# Patient Record
Sex: Male | Born: 1980 | Race: White | Hispanic: No | Marital: Married | Smoking: Current every day smoker
Health system: Southern US, Community
[De-identification: ages and names within clinical notes are randomized; demographics above are authoritative.]

## PROBLEM LIST (undated history)

## (undated) DIAGNOSIS — G039 Meningitis, unspecified: Secondary | ICD-10-CM

---

## 2014-10-22 ENCOUNTER — Encounter (HOSPITAL_BASED_OUTPATIENT_CLINIC_OR_DEPARTMENT_OTHER): Payer: Self-pay | Admitting: *Deleted

## 2014-10-22 ENCOUNTER — Emergency Department (HOSPITAL_BASED_OUTPATIENT_CLINIC_OR_DEPARTMENT_OTHER): Payer: 59

## 2014-10-22 ENCOUNTER — Emergency Department (HOSPITAL_BASED_OUTPATIENT_CLINIC_OR_DEPARTMENT_OTHER)
Admission: EM | Admit: 2014-10-22 | Discharge: 2014-10-22 | Disposition: A | Discharge status: Home / Self Care | Payer: 59 | Attending: Emergency Medicine | Admitting: Emergency Medicine

## 2014-10-22 DIAGNOSIS — M791 Myalgia, unspecified site: Secondary | ICD-10-CM

## 2014-10-22 DIAGNOSIS — Z8669 Personal history of other diseases of the nervous system and sense organs: Secondary | ICD-10-CM | POA: Diagnosis not present

## 2014-10-22 DIAGNOSIS — R519 Headache, unspecified: Secondary | ICD-10-CM

## 2014-10-22 DIAGNOSIS — Z720 Tobacco use: Secondary | ICD-10-CM | POA: Insufficient documentation

## 2014-10-22 DIAGNOSIS — R51 Headache: Secondary | ICD-10-CM | POA: Diagnosis not present

## 2014-10-22 DIAGNOSIS — R05 Cough: Secondary | ICD-10-CM | POA: Insufficient documentation

## 2014-10-22 DIAGNOSIS — R21 Rash and other nonspecific skin eruption: Secondary | ICD-10-CM | POA: Insufficient documentation

## 2014-10-22 DIAGNOSIS — R509 Fever, unspecified: Secondary | ICD-10-CM | POA: Diagnosis not present

## 2014-10-22 HISTORY — DX: Meningitis, unspecified: G03.9

## 2014-10-22 LAB — CK: Total CK: 136 U/L (ref 7–232)

## 2014-10-22 LAB — CBC WITH DIFFERENTIAL/PLATELET
BASOS ABS: 0 10*3/uL (ref 0.0–0.1)
Basophils Relative: 0 % (ref 0–1)
EOS ABS: 0 10*3/uL (ref 0.0–0.7)
Eosinophils Relative: 1 % (ref 0–5)
HEMATOCRIT: 43.3 % (ref 39.0–52.0)
HEMOGLOBIN: 14.8 g/dL (ref 13.0–17.0)
Lymphocytes Relative: 25 % (ref 12–46)
Lymphs Abs: 0.6 10*3/uL — ABNORMAL LOW (ref 0.7–4.0)
MCH: 31.4 pg (ref 26.0–34.0)
MCHC: 34.2 g/dL (ref 30.0–36.0)
MCV: 91.7 fL (ref 78.0–100.0)
MONOS PCT: 15 % — AB (ref 3–12)
Monocytes Absolute: 0.4 10*3/uL (ref 0.1–1.0)
NEUTROS ABS: 1.5 10*3/uL — AB (ref 1.7–7.7)
Neutrophils Relative %: 59 % (ref 43–77)
Platelets: 119 10*3/uL — ABNORMAL LOW (ref 150–400)
RBC: 4.72 MIL/uL (ref 4.22–5.81)
RDW: 12 % (ref 11.5–15.5)
WBC: 2.6 10*3/uL — AB (ref 4.0–10.5)

## 2014-10-22 LAB — URINALYSIS, ROUTINE W REFLEX MICROSCOPIC
BILIRUBIN URINE: NEGATIVE
GLUCOSE, UA: NEGATIVE mg/dL
KETONES UR: 15 mg/dL — AB
Leukocytes, UA: NEGATIVE
Nitrite: NEGATIVE
PH: 6.5 (ref 5.0–8.0)
Protein, ur: NEGATIVE mg/dL
SPECIFIC GRAVITY, URINE: 1.017 (ref 1.005–1.030)
Urobilinogen, UA: 1 mg/dL (ref 0.0–1.0)

## 2014-10-22 LAB — BASIC METABOLIC PANEL
Anion gap: 3 — ABNORMAL LOW (ref 5–15)
BUN: 15 mg/dL (ref 6–23)
CHLORIDE: 103 mmol/L (ref 96–112)
CO2: 26 mmol/L (ref 19–32)
CREATININE: 0.99 mg/dL (ref 0.50–1.35)
Calcium: 8.1 mg/dL — ABNORMAL LOW (ref 8.4–10.5)
GFR calc Af Amer: >90 mL/min (ref >90)
Glucose, Bld: 101 mg/dL — ABNORMAL HIGH (ref 70–99)
Potassium: 3.5 mmol/L (ref 3.5–5.1)
Sodium: 132 mmol/L — ABNORMAL LOW (ref 135–145)

## 2014-10-22 LAB — URINE MICROSCOPIC-ADD ON

## 2014-10-22 LAB — RAPID STREP SCREEN (MED CTR MEBANE ONLY): STREPTOCOCCUS, GROUP A SCREEN (DIRECT): NEGATIVE

## 2014-10-22 MED ORDER — SODIUM CHLORIDE 0.9 % IV BOLUS (SEPSIS)
1000.0000 mL | Freq: Once | INTRAVENOUS | Status: AC
  Administered 2014-10-22: 1000 mL via INTRAVENOUS

## 2014-10-22 MED ORDER — ONDANSETRON HCL 4 MG/2ML IJ SOLN
4.0000 mg | Freq: Once | INTRAMUSCULAR | Status: AC
  Administered 2014-10-22: 4 mg via INTRAVENOUS
  Filled 2014-10-22: qty 2

## 2014-10-22 MED ORDER — ONDANSETRON 4 MG PO TBDP: ORAL_TABLET | ORAL | Status: AC

## 2014-10-22 NOTE — ED Provider Notes (Signed)
CSN: [id number]     Arrival date & time 10/22/14  0913 History   First MD Initiated Contact with Patient 10/22/14 [phone]     Chief Complaint  Patient presents with  . Fever     (Consider location/radiation/quality/duration/timing/severity/associated sxs/prior Treatment) Patient is a 34 y.o. male presenting with cough.  Cough Cough characteristics:  Non-productive Severity:  Moderate Onset quality:  Gradual Duration:  4 days Timing:  Constant Progression:  Worsening Chronicity:  New Relieved by:  Nothing Worsened by:  Nothing tried Associated symptoms: chills, fever (tmax 102), myalgias, rash (R arm) and sinus congestion   Associated symptoms: no chest pain and no shortness of breath     Past Medical History  Diagnosis Date  . Meningitis    History reviewed. No pertinent past surgical history. No family history on file. History  Substance Use Topics  . Smoking status: Current Every Day Smoker -- 1.00 packs/day    Types: Cigarettes  . Smokeless tobacco: Not on file  . Alcohol Use: Not on file    Review of Systems  Constitutional: Positive for fever (tmax 102) and chills.  Respiratory: Positive for cough. Negative for shortness of breath.   Cardiovascular: Negative for chest pain.  Musculoskeletal: Positive for myalgias.  Skin: Positive for rash (R arm).  All other systems reviewed and are negative.     Allergies  Doxycycline  Home Medications   Prior to Admission medications   Medication Sig Start Date End Date Taking? Authorizing Provider  ondansetron (ZOFRAN ODT) 4 MG disintegrating tablet  ODT q4 hours prn nausea/vomit 10/22/14   Mirian Mo, MD   BP 108/65 mmHg  Pulse 68  Temp(Src) 99.8 F (37.7 C) (Oral)  Resp 18  Ht 6' (1.829 m)  Wt 143 lb (64.864 kg)  BMI 19.39 kg/m2  SpO2 98% Physical Exam  Constitutional: He is oriented to person, place, and time. He appears well-developed and well-nourished.  HENT:  Head: Normocephalic and atraumatic.   Eyes: Conjunctivae and EOM are normal.  Neck: Normal range of motion. Neck supple. No Brudzinski's sign and no Kernig's sign noted.  Cardiovascular: Normal rate, regular rhythm and normal heart sounds.   Pulmonary/Chest: Effort normal and breath sounds normal. No respiratory distress.  Abdominal: He exhibits no distension. There is no tenderness. There is no rebound and no guarding.  Musculoskeletal: Normal range of motion.  Neurological: He is alert and oriented to person, place, and time.  Skin: Skin is warm and dry.  Vitals reviewed.   ED Course  Procedures (including critical care time) Labs Review Labs Reviewed  CBC WITH DIFFERENTIAL/PLATELET - Abnormal; Notable for the following:    WBC 2.6 (*)    Platelets 119 (*)    Neutro Abs 1.5 (*)    Lymphs Abs 0.6 (*)    Monocytes Relative 15 (*)    All other components within normal limits  BASIC METABOLIC PANEL - Abnormal; Notable for the following:    Sodium 132 (*)    Glucose, Bld 101 (*)    Calcium 8.1 (*)    Anion gap 3 (*)    All other components within normal limits  URINALYSIS, ROUTINE W REFLEX MICROSCOPIC - Abnormal; Notable for the following:    Hgb urine dipstick TRACE (*)    Ketones, ur 15 (*)    All other components within normal limits  RAPID STREP SCREEN  CULTURE, GROUP A STREP  CK  URINE MICROSCOPIC-ADD ON    Imaging Review Dg Chest 2 View  10/22/2014  CLINICAL DATA:  Cough, fever and sinus congestion.  Smoker.  EXAM: CHEST - 2 VIEW  COMPARISON:  [facility]  FINDINGS: The heart size and mediastinal contours are within normal limits. There is no evidence of pulmonary edema, consolidation, pneumothorax, nodule or pleural fluid. The visualized skeletal structures are unremarkable.  IMPRESSION: No active disease.   Electronically Signed   By: Irish LackGlenn  Yamagata M.D.   On: 10/22/2014 11:33     EKG Interpretation None      MDM   Final diagnoses:  Fever, unspecified fever  cause  Acute nonintractable headache, unspecified headache type  Myalgia    34 y.o. male with pertinent PMH of meningitis at age 406 mo and 34 yo presents with congestion, cough, myalgias, fever x 4 days.  Pt was seen by PCP, prescribed azithromycin.  Presents today due to continued symptoms. He states he had a ha, however this is improved.  Physical exam and vitals today as above.  No meningitic signs.    Wu unremarkable.  Symptoms improved with fluids.  I had a detailed discussion with pt re: likely viral etiology and possibility of viral meningitis, but doubt meningitis given appearance of pt with FROM, overall appearance.  Dorna BloomWu remarkable for neutropenia, pt also neutropenic 2 days ago, unchanged WBC per family today.  ANC 1.5, do not feel pt immunosuppressed at this time.  Discussed and offered admission at this time, and with shared decision making agreed to dc home with close PCP fu and return if necessary.   I have reviewed all laboratory and imaging studies if ordered as above  1. Fever, unspecified fever cause   2. Acute nonintractable headache, unspecified headache type   3. Myalgia          Mirian MoMatthew Aryon Nham, MD 10/23/14 [phone]

## 2014-10-22 NOTE — ED Notes (Signed)
C/o fever since Wednesday with h/a and neck pain. PT states when he looks up his h/a is worse. C/o sinus congestion and left ear pain.Has had cough with clear to brown sputum. Nauseated but no d/v. States hx of meningitis at age 696 months and at age 34.

## 2014-10-22 NOTE — Discharge Instructions (Signed)
Neutropenia (your absolute neutrophil count is 1500) Neutropenia is a condition that occurs when the level of a certain type of white blood cell (neutrophil) in your body becomes lower than normal. Neutrophils are made in the bone marrow and fight infections. These cells protect against bacteria and viruses. The fewer neutrophils you have, and the longer your body remains without them, the greater your risk of getting a severe infection becomes. CAUSES  The cause of neutropenia may be hard to determine. However, it is usually due to 3 main problems:   Decreased production of neutrophils. This may be due to:  Certain medicines such as chemotherapy.  Genetic problems.  Cancer.  Radiation treatments.  Vitamin deficiency.  Some pesticides.  Increased destruction of neutrophils. This may be due to:  Overwhelming infections.  Hemolytic anemia. This is when the body destroys its own blood cells.  Chemotherapy.  Neutrophils moving to areas of the body where they cannot fight infections. This may be due to:  Dialysis procedures.  Conditions where the spleen becomes enlarged. Neutrophils are held in the spleen and are not available to the rest of the body.  Overwhelming infections. The neutrophils are held in the area of the infection and are not available to the rest of the body. SYMPTOMS  There are no specific symptoms of neutropenia. The lack of neutrophils can result in an infection, and an infection can cause various problems. DIAGNOSIS  Diagnosis is made by a blood test. A complete blood count is performed. The normal level of neutrophils in human blood differs with age and race. Infants have lower counts than older children and adults. African Americans have lower counts than Caucasians or Asians. The average adult level is 1500 cells/mm3 of blood. Neutrophil counts are interpreted as follows:  Greater than 1000 cells/mm3 gives normal protection against infection.  500 to 1000  cells/mm3 gives an increased risk for infection.  200 to 500 cells/mm3 is a greater risk for severe infection.  Lower than 200 cells/mm3 is a marked risk of infection. This may require hospitalization and treatment with antibiotic medicines. TREATMENT  Treatment depends on the underlying cause, severity, and presence of infections or symptoms. It also depends on your health. Your caregiver will discuss the treatment plan with you. Mild cases are often easily treated and have a good outcome. Preventative measures may also be started to limit your risk of infections. Treatment can include:  Taking antibiotics.  Stopping medicines that are known to cause neutropenia.  Correcting nutritional deficiencies by eating green vegetables to supply folic acid and taking vitamin B supplements.  Stopping exposure to pesticides if your neutropenia is related to pesticide exposure.  Taking a blood growth factor called sargramostim, pegfilgrastim, or filgrastim if you are undergoing chemotherapy for cancer. This stimulates white blood cell production.  Removal of the spleen if you have Felty's syndrome and have repeated infections. HOME CARE INSTRUCTIONS   Follow your caregiver's instructions about when you need to have blood work done.  Wash your hands often. Make sure others who come in contact with you also wash their hands.  Wash raw fruits and vegetables before eating them. They can carry bacteria and fungi.  Avoid people with colds or spreadable (contagious) diseases (chickenpox, herpes zoster, influenza).  Avoid large crowds.  Avoid construction areas. The dust can release fungus into the air.  Be cautious around children in daycare or school environments.  Take care of your respiratory system by coughing and deep breathing.  Bathe daily.  Protect your skin from cuts and burns.  Do not work in the garden or with flowers and plants.  Care for the mouth before and after meals by  brushing with a soft toothbrush. If you have mucositis, do not use mouthwash. Mouthwash contains alcohol and can dry out the mouth even more.  Clean the area between the genitals and the anus (perineal area) after urination and bowel movements. Women need to wipe from front to back.  Use a water soluble lubricant during sexual intercourse and practice good hygiene after. Do not have intercourse if you are severely neutropenic. Check with your caregiver for guidelines.  Exercise daily as tolerated.  Avoid people who were vaccinated with a live vaccine in the past 30 days. You should not receive live vaccines (polio, typhoid).  Do not provide direct care for pets. Avoid animal droppings. Do not clean litter boxes and bird cages.  Do not share food utensils.  Do not use tampons, enemas, or rectal suppositories unless directed by your caregiver.  Use an electric razor to remove hair.  Wash your hands after handling magazines, letters, and newspapers. SEEK IMMEDIATE MEDICAL CARE IF:   You have a fever.  You have chills or start to shake.  You feel nauseous or vomit.  You develop mouth sores.  You develop aches and pains.  You have redness and swelling around open wounds.  Your skin is warm to the touch.  You have pus coming from your wounds.  You develop swollen lymph nodes.  You feel weak or fatigued.  You develop red streaks on the skin. MAKE SURE YOU:  Understand these instructions.  Will watch your condition.  Will get help right away if you are not doing well or get worse. Document Released: 01/25/2002 Document Revised: 10/28/2011 Document Reviewed: 02/22/2011 [facility] Patient Information 2015 [place], [place]. This information is not intended to replace advice given to you by your health care provider. Make sure you discuss any questions you have with your health care provider.  Sinus Headache A sinus headache is when your sinuses become clogged or swollen. Sinus  headaches can range from mild to severe.  CAUSES A sinus headache can have different causes, such as:  Colds.  Sinus infections.  Allergies. SYMPTOMS  Symptoms of a sinus headache may vary and can include:  Headache.  Pain or pressure in the face.  Congested or runny nose.  Fever.  Inability to smell.  Pain in upper teeth. Weather changes can make symptoms worse. TREATMENT  The treatment of a sinus headache depends on the cause.  Sinus pain caused by a sinus infection may be treated with antibiotic medicine.  Sinus pain caused by allergies may be helped by allergy medicines (antihistamines) and medicated nasal sprays.  Sinus pain caused by congestion may be helped by flushing the nose and sinuses with saline solution. HOME CARE INSTRUCTIONS   If antibiotics are prescribed, take them as directed. Finish them even if you start to feel better.  Only take over-the-counter or prescription medicines for pain, discomfort, or fever as directed by your caregiver.  If you have congestion, use a nasal spray to help reduce pressure. SEEK IMMEDIATE MEDICAL CARE IF:  You have a fever.  You have headaches more than once a week.  You have sensitivity to light or sound.  You have repeated nausea and vomiting.  You have vision problems.  You have sudden, severe pain in your face or head.  You have a seizure.  You are  confused.  Your sinus headaches do not get better after treatment. Many people think they have a sinus headache when they actually have migraines or tension headaches. MAKE SURE YOU:   Understand these instructions.  Will watch your condition.  Will get help right away if you are not doing well or get worse. Document Released: 09/12/2004 Document Revised: 10/28/2011 Document Reviewed: 11/03/2010 [facility] Patient Information 2015 [place]. This information is not intended to replace advice given to you by your health care provider. Make sure you  discuss any questions you have with your health care provider. Viral Infections A viral infection can be caused by different types of viruses.Most viral infections are not serious and resolve on their own. However, some infections may cause severe symptoms and may lead to further complications. SYMPTOMS Viruses can frequently cause:  Minor sore throat.  Aches and pains.  Headaches.  Runny nose.  Different types of rashes.  Watery eyes.  Tiredness.  Cough.  Loss of appetite.  Gastrointestinal infections, resulting in nausea, vomiting, and diarrhea. These symptoms do not respond to antibiotics because the infection is not caused by bacteria. However, you might catch a bacterial infection following the viral infection. This is sometimes called a "superinfection." Symptoms of such a bacterial infection may include:  Worsening sore throat with pus and difficulty swallowing.  Swollen neck glands.  Chills and a high or persistent fever.  Severe headache.  Tenderness over the sinuses.  Persistent overall ill feeling (malaise), muscle aches, and tiredness (fatigue).  Persistent cough.  Yellow, green, or brown mucus production with coughing. HOME CARE INSTRUCTIONS   Only take over-the-counter or prescription medicines for pain, discomfort, diarrhea, or fever as directed by your caregiver.  Drink enough water and fluids to keep your urine clear or pale yellow. Sports drinks can provide valuable electrolytes, sugars, and hydration.  Get plenty of rest and maintain proper nutrition. Soups and broths with crackers or rice are fine. SEEK IMMEDIATE MEDICAL CARE IF:   You have severe headaches, shortness of breath, chest pain, neck pain, or an unusual rash.  You have uncontrolled vomiting, diarrhea, or you are unable to keep down fluids.  You or your child has an oral temperature above 102 F (38.9 C), not controlled by medicine.  Your baby is older than 3 months with a  rectal temperature of 102 F (38.9 C) or higher.  Your baby is 763 months old or younger with a rectal temperature of 100.4 F (38 C) or higher. MAKE SURE YOU:   Understand these instructions.  Will watch your condition.  Will get help right away if you are not doing well or get worse. Document Released: 05/15/2005 Document Revised: 10/28/2011 Document Reviewed: 12/10/2010 [facility] Patient Information 2015 [place]. This information is not intended to replace advice given to you by your health care provider. Make sure you discuss any questions you have with your health care provider.

## 2014-10-24 LAB — CULTURE, GROUP A STREP: Strep A Culture: NEGATIVE

## 2016-05-27 IMAGING — CR DG CHEST 2V
2 series · 2 of 2 positions shown · non-contrast
Comparison: Chest x-ray on 12/30/2008 from [REDACTED].

CLINICAL DATA: Cough, fever and sinus congestion.  Smoker.

EXAM:
CHEST - 2 VIEW

[w chest pa]
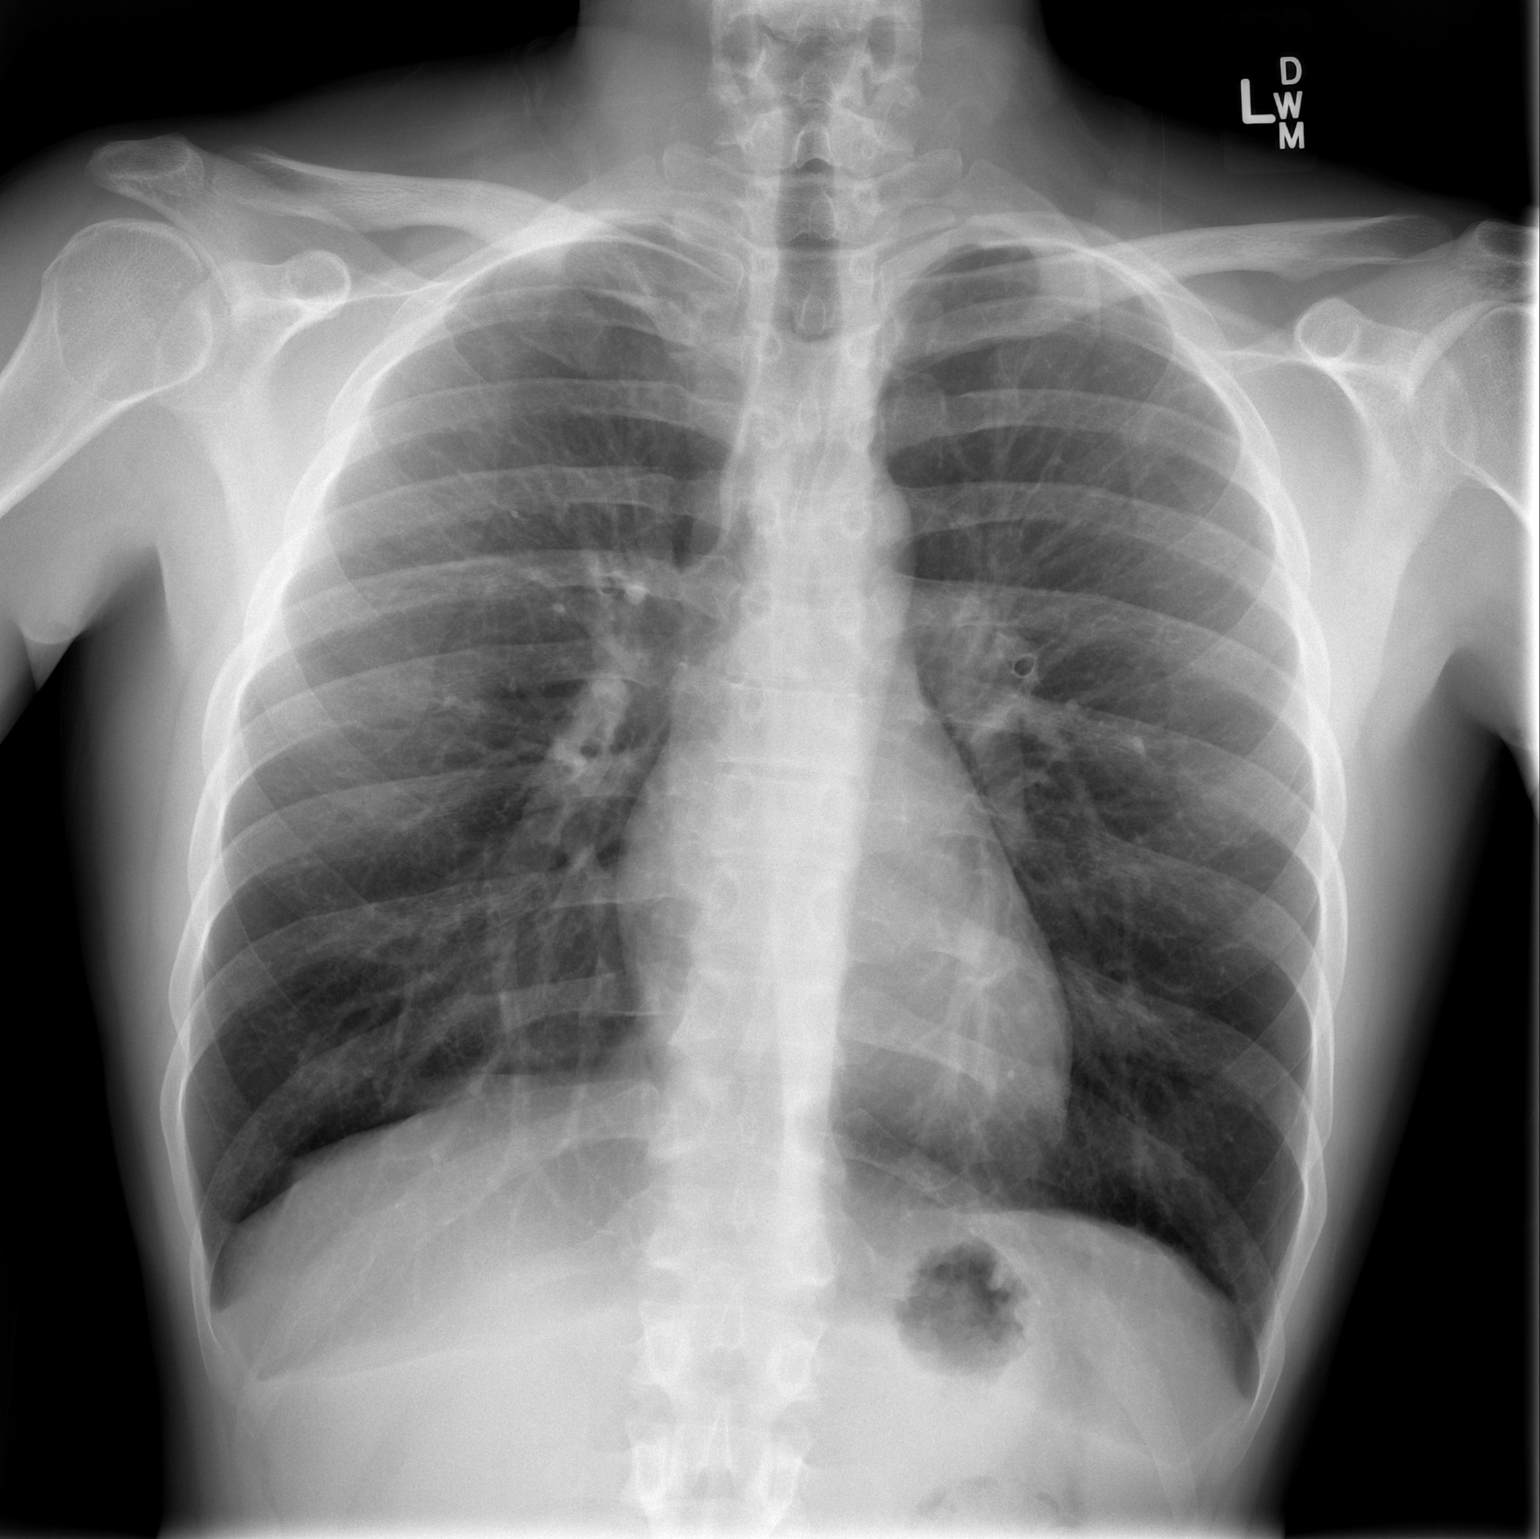

[w chest lat]
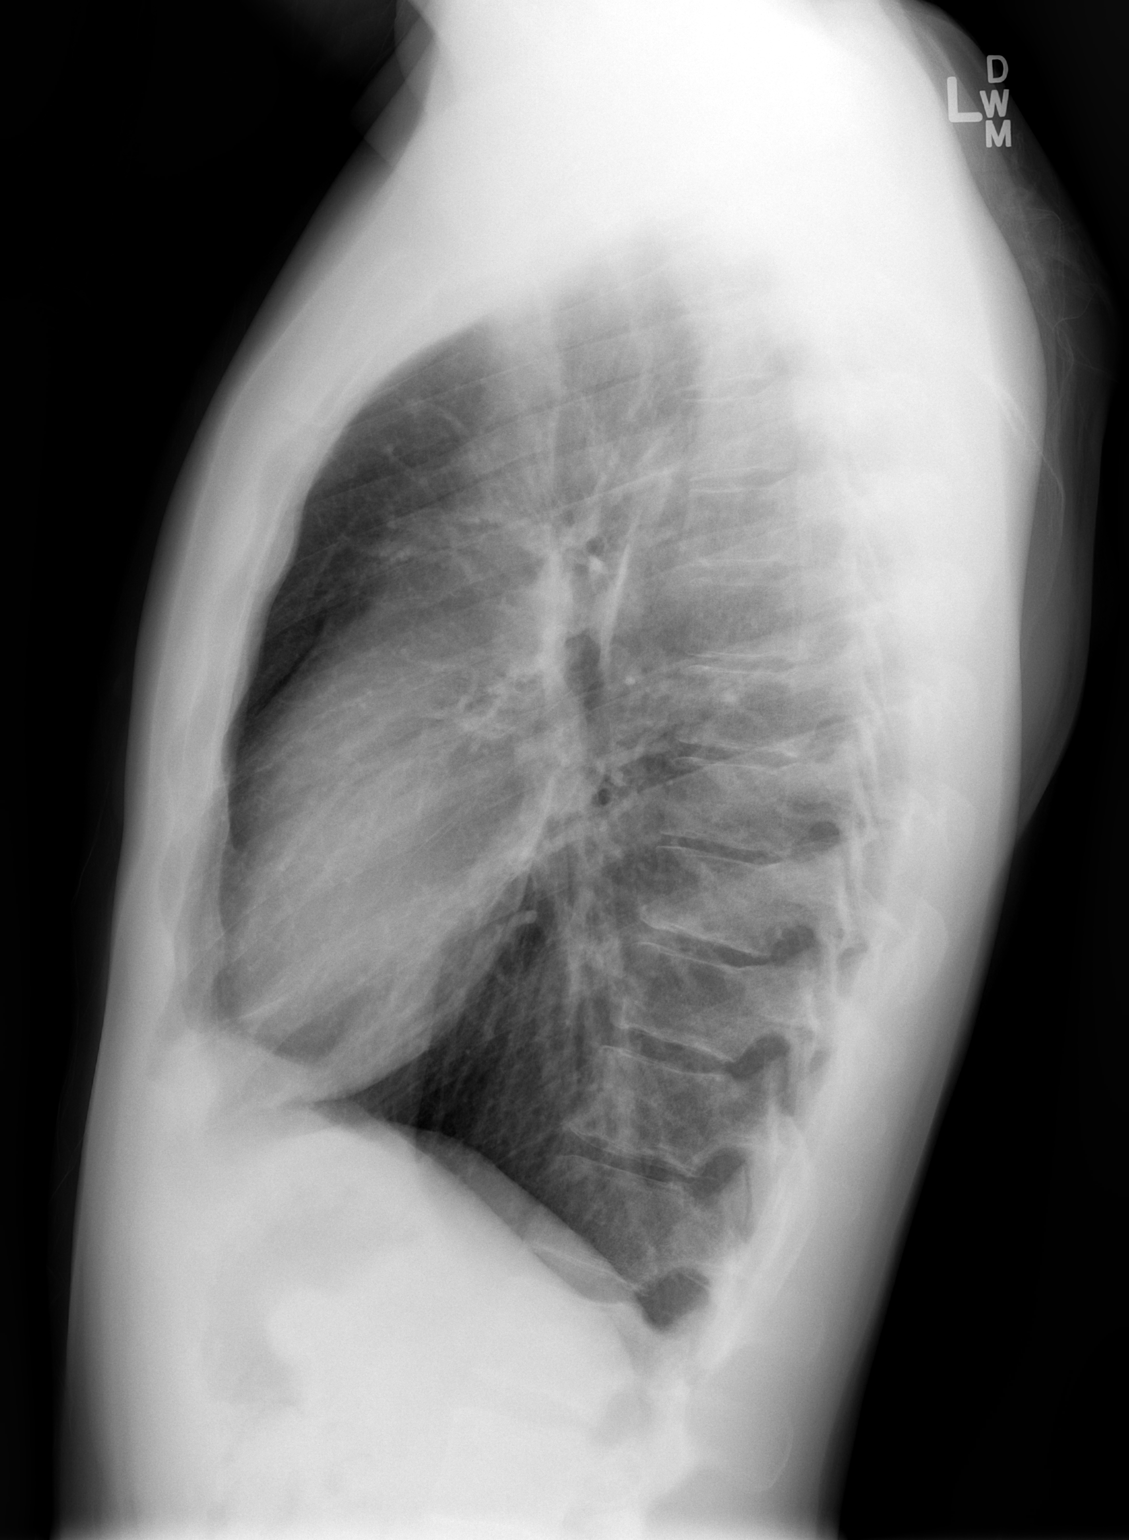

[2 of 2 positions shown; findings below may reference images not displayed]

FINDINGS: The heart size and mediastinal contours are within normal limits.
There is no evidence of pulmonary edema, consolidation,
pneumothorax, nodule or pleural fluid. The visualized skeletal
structures are unremarkable.
IMPRESSION: No active disease.
